# Patient Record
Sex: Female | Born: 2005 | Race: Black or African American | Hispanic: No | Marital: Single | Smoking: Never smoker
Health system: Southern US, Community
[De-identification: ages and names within clinical notes are randomized; demographics above are authoritative.]

## PROBLEM LIST (undated history)

## (undated) HISTORY — PX: HERNIA REPAIR: SHX51

---
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
## 2024-04-29 ENCOUNTER — Encounter: Payer: Self-pay | Admitting: Family Medicine
